# Patient Record
Sex: Male | Born: 1980 | Race: Black or African American | Hispanic: No | Marital: Single | Smoking: Never smoker
Health system: Southern US, Community
[De-identification: ages and names within clinical notes are randomized; demographics above are authoritative.]

## PROBLEM LIST (undated history)

## (undated) HISTORY — PX: APPENDECTOMY: SHX54

---

## 2020-04-05 ENCOUNTER — Encounter (HOSPITAL_BASED_OUTPATIENT_CLINIC_OR_DEPARTMENT_OTHER): Payer: Self-pay | Admitting: Emergency Medicine

## 2020-04-05 ENCOUNTER — Emergency Department (HOSPITAL_BASED_OUTPATIENT_CLINIC_OR_DEPARTMENT_OTHER): Payer: No Typology Code available for payment source

## 2020-04-05 ENCOUNTER — Other Ambulatory Visit: Payer: Self-pay

## 2020-04-05 ENCOUNTER — Emergency Department (HOSPITAL_BASED_OUTPATIENT_CLINIC_OR_DEPARTMENT_OTHER)
Admission: EM | Admit: 2020-04-05 | Discharge: 2020-04-05 | Disposition: A | Discharge status: Home / Self Care | Payer: No Typology Code available for payment source | Attending: Emergency Medicine | Admitting: Emergency Medicine

## 2020-04-05 DIAGNOSIS — M50222 Other cervical disc displacement at C5-C6 level: Secondary | ICD-10-CM | POA: Diagnosis not present

## 2020-04-05 DIAGNOSIS — M791 Myalgia, unspecified site: Secondary | ICD-10-CM | POA: Insufficient documentation

## 2020-04-05 DIAGNOSIS — Y92410 Unspecified street and highway as the place of occurrence of the external cause: Secondary | ICD-10-CM | POA: Insufficient documentation

## 2020-04-05 DIAGNOSIS — R519 Headache, unspecified: Secondary | ICD-10-CM | POA: Diagnosis not present

## 2020-04-05 DIAGNOSIS — M25511 Pain in right shoulder: Secondary | ICD-10-CM | POA: Insufficient documentation

## 2020-04-05 DIAGNOSIS — M7918 Myalgia, other site: Secondary | ICD-10-CM

## 2020-04-05 DIAGNOSIS — Y9389 Activity, other specified: Secondary | ICD-10-CM | POA: Insufficient documentation

## 2020-04-05 DIAGNOSIS — M542 Cervicalgia: Secondary | ICD-10-CM | POA: Diagnosis not present

## 2020-04-05 DIAGNOSIS — M25561 Pain in right knee: Secondary | ICD-10-CM | POA: Diagnosis not present

## 2020-04-05 MED ORDER — NAPROXEN 250 MG PO TABS
500.0000 mg | ORAL_TABLET | Freq: Once | ORAL | Status: AC
  Administered 2020-04-05: 500 mg via ORAL
  Filled 2020-04-05: qty 2

## 2020-04-05 MED ORDER — NAPROXEN 500 MG PO TABS: 500.0000 mg | ORAL_TABLET | Freq: Two times a day (BID) | ORAL | 0 refills | Status: AC

## 2020-04-05 MED ORDER — METHOCARBAMOL 500 MG PO TABS: 500.0000 mg | ORAL_TABLET | Freq: Two times a day (BID) | ORAL | 0 refills | Status: AC
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
## 2020-04-05 NOTE — Discharge Instructions (Signed)
The pain you are experiencing is likely due to muscle strain and the CT scan of her neck did show a possible disc herniation at C5-C6, if you begin having any numbness weakness or tingling in your arms you should follow-up with [facility].  You may take Naprosyn and Robaxin as needed for pain management. Do not combine with any pain reliever other than tylenol.  You may also use ice and heat, and over-the-counter remedies such as Biofreeze gel or salon pas lidocaine patches. The muscle soreness should improve over the next week. Follow up with your family doctor in the next week for a recheck if you are still having symptoms. Return to ED if pain is worsening, you develop weakness or numbness of extremities, or new or concerning symptoms develop.
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
